# Patient Record
Sex: Female | Born: 1965 | Hispanic: Yes | Smoking: Never smoker
Health system: Southern US, Community
[De-identification: ages and names within clinical notes are randomized; demographics above are authoritative.]

---

## 2017-03-14 ENCOUNTER — Other Ambulatory Visit: Payer: Self-pay | Admitting: Occupational Medicine

## 2017-03-14 ENCOUNTER — Ambulatory Visit: Payer: Self-pay

## 2017-03-14 DIAGNOSIS — M25522 Pain in left elbow: Secondary | ICD-10-CM

## 2019-01-24 ENCOUNTER — Ambulatory Visit (INDEPENDENT_AMBULATORY_CARE_PROVIDER_SITE_OTHER): Payer: Worker's Compensation

## 2019-01-24 ENCOUNTER — Other Ambulatory Visit: Payer: Self-pay

## 2019-01-24 ENCOUNTER — Ambulatory Visit (HOSPITAL_COMMUNITY)
Admission: EM | Admit: 2019-01-24 | Discharge: 2019-01-24 | Disposition: A | Discharge status: Home / Self Care | Payer: Worker's Compensation | Attending: Internal Medicine | Admitting: Internal Medicine

## 2019-01-24 ENCOUNTER — Encounter (HOSPITAL_COMMUNITY): Payer: Self-pay

## 2019-01-24 DIAGNOSIS — S62101A Fracture of unspecified carpal bone, right wrist, initial encounter for closed fracture: Secondary | ICD-10-CM

## 2019-01-24 MED ORDER — MELOXICAM 7.5 MG PO TABS: 7.5000 mg | ORAL_TABLET | Freq: Two times a day (BID) | ORAL | 0 refills | Status: AC | PRN

## 2019-01-24 NOTE — ED Triage Notes (Signed)
Pt states she slipped and fell in the bathroom this morning. Pt states she injured her right hand and wrist.

## 2020-09-28 IMAGING — DX DG HAND COMPLETE 3+V*R*
3 series · 3 of 3 positions shown · non-contrast
Comparison: None.

CLINICAL DATA: Right hand and wrist pain, fall

EXAM:
RIGHT WRIST - COMPLETE 3+ VIEW; RIGHT HAND - COMPLETE 3+ VIEW

[hand pa]
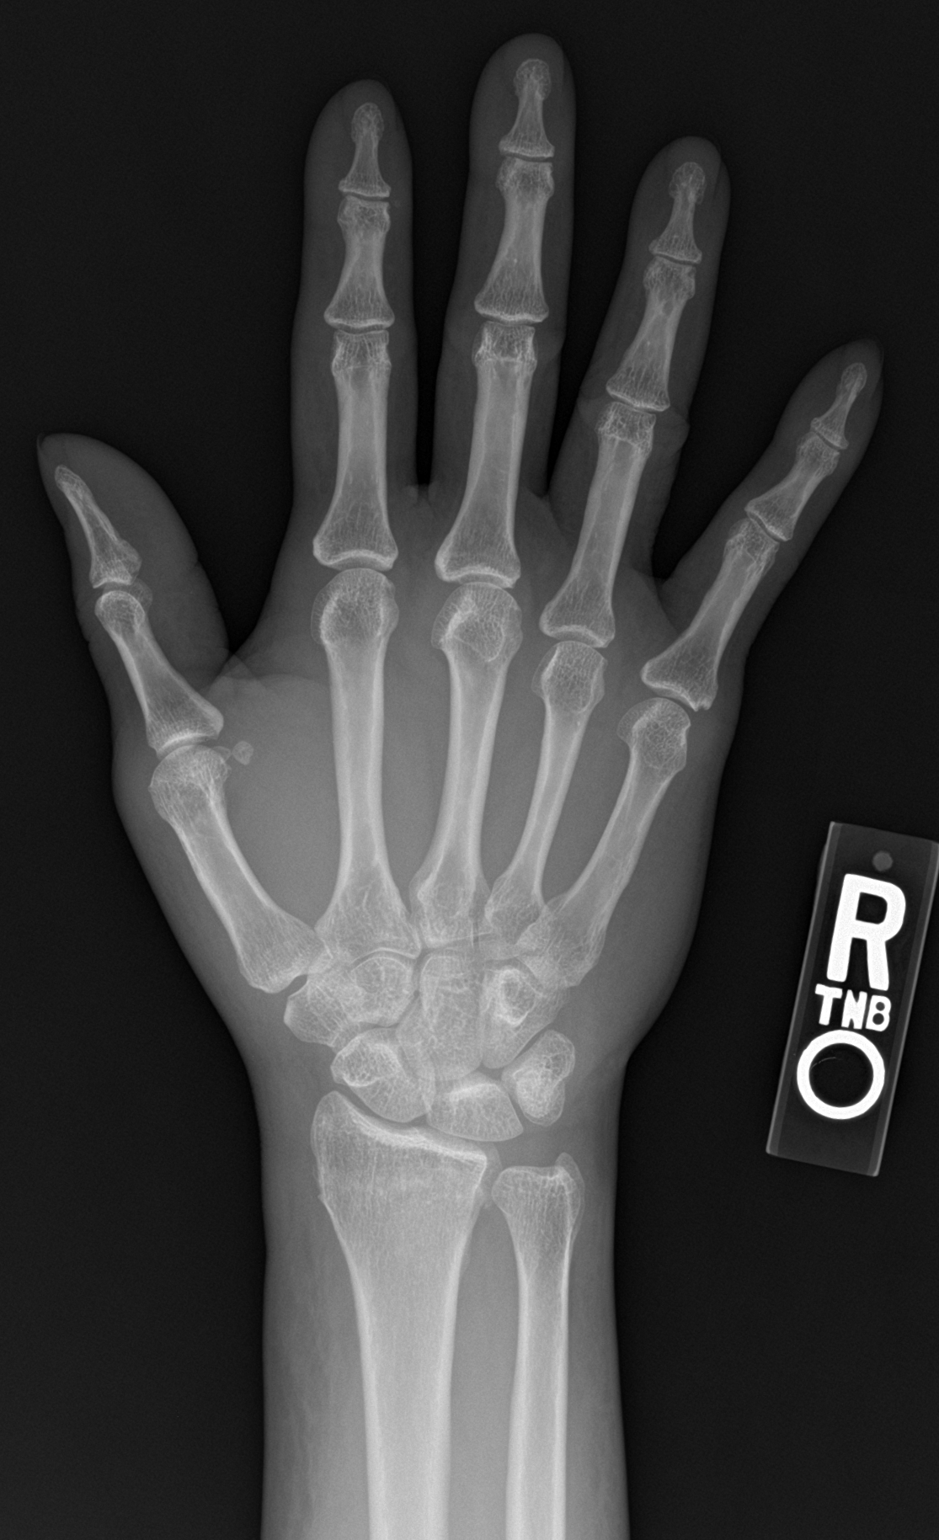

[hand obl]
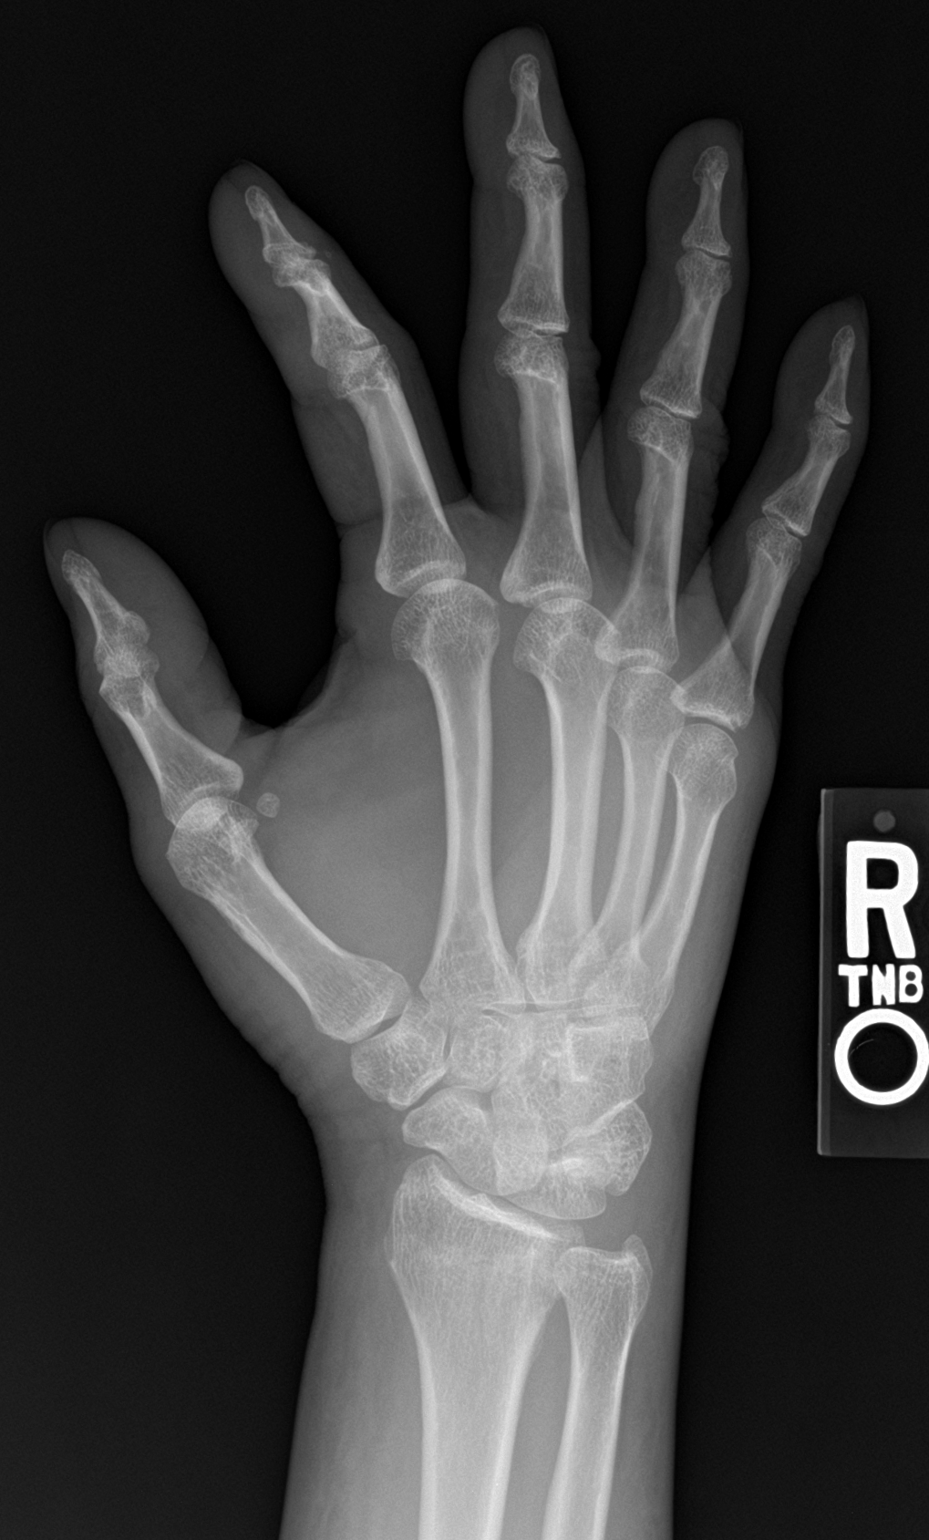

[hand lat]
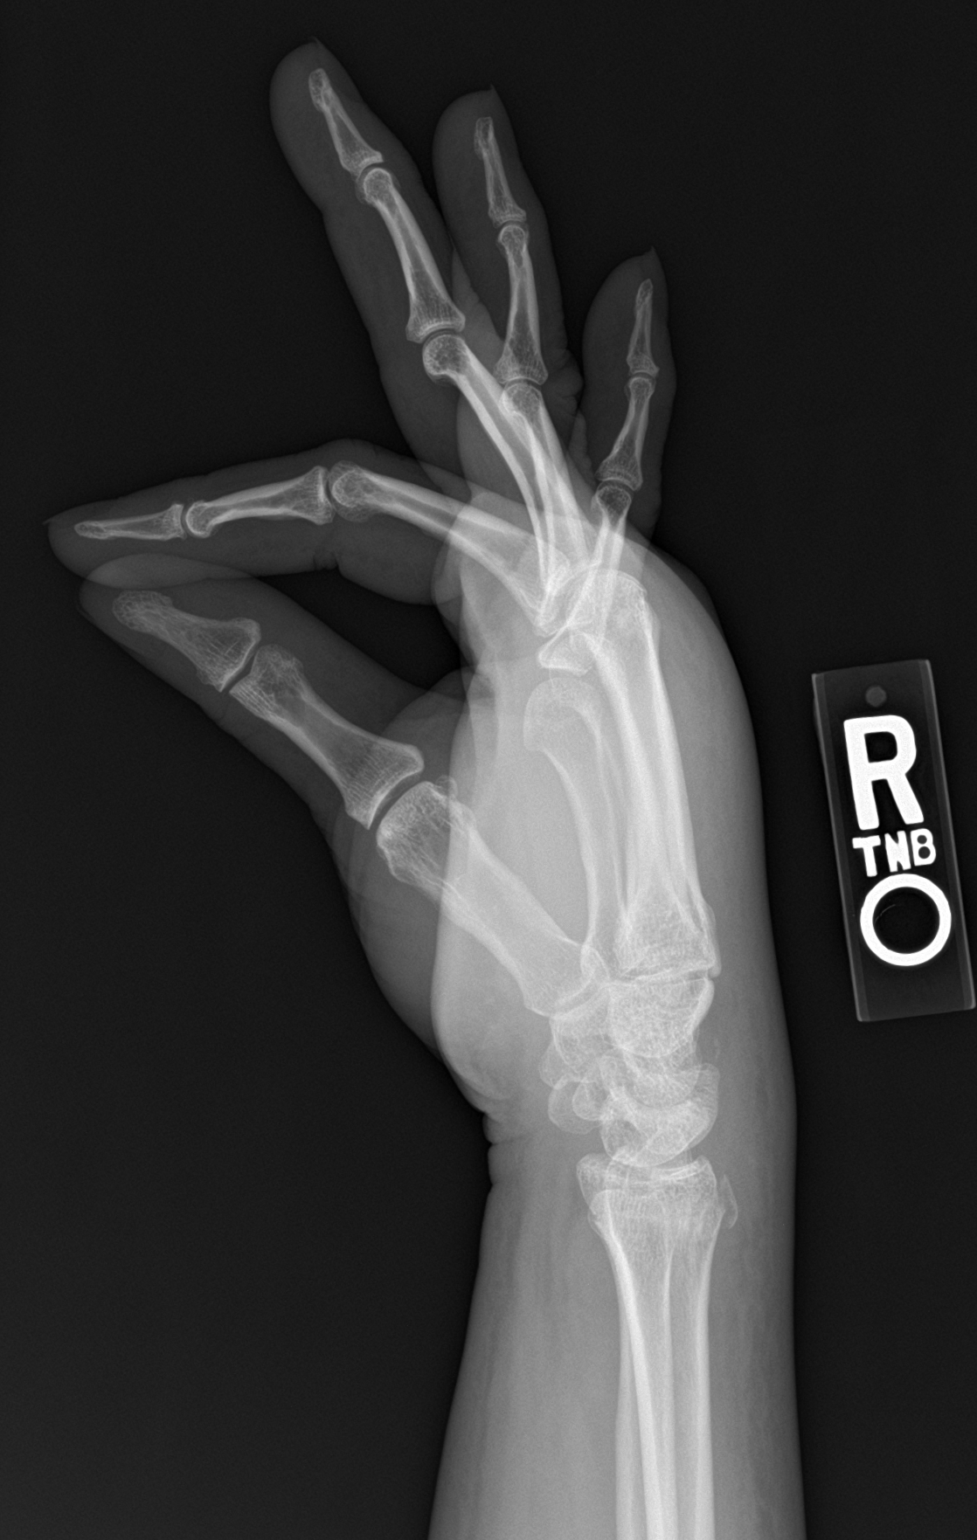

[3 of 3 positions shown; findings below may reference images not displayed]

FINDINGS: No fracture or dislocation of the right hand. Joint spaces are
preserved.

Mildly impacted fracture of the distal right radial metadiaphysis
without significant angulation. Additional, very subtle fracture
fragments adjacent to the dorsum of the carpus, likely triquetral
avulsion. The distal ulna is intact. Diffuse soft tissue edema about
the dorsum of the right hand and wrist.
IMPRESSION: 1. No fracture or dislocation of the right hand.

2. Mildly impacted fracture of the distal right radial metadiaphysis
without significant angulation. Additional, very subtle fracture
fragments adjacent to the dorsum of the carpus, likely triquetral
avulsion. The distal ulna is intact.
